# Patient Record
Sex: Female | Born: 1955 | Race: White | Hispanic: No | Marital: Single | Smoking: Never smoker
Health system: Southern US, Community
[De-identification: ages and names within clinical notes are randomized; demographics above are authoritative.]

## PROBLEM LIST (undated history)

## (undated) DIAGNOSIS — T7840XA Allergy, unspecified, initial encounter: Secondary | ICD-10-CM

## (undated) HISTORY — DX: Allergy, unspecified, initial encounter: T78.40XA

## (undated) HISTORY — PX: COLONOSCOPY: SHX174

---

## 1988-12-14 HISTORY — PX: TONSILLECTOMY: SUR1361

## 1988-12-14 HISTORY — PX: SEPTOPLASTY: SUR1290

## 1998-11-15 ENCOUNTER — Other Ambulatory Visit: Admission: RE | Admit: 1998-11-15 | Discharge: 1998-11-15 | Payer: Self-pay | Admitting: Family Medicine

## 1999-11-25 ENCOUNTER — Ambulatory Visit (HOSPITAL_BASED_OUTPATIENT_CLINIC_OR_DEPARTMENT_OTHER): Admission: RE | Admit: 1999-11-25 | Discharge: 1999-11-25 | Payer: Self-pay | Admitting: Otolaryngology

## 2007-01-24 ENCOUNTER — Other Ambulatory Visit: Admission: RE | Admit: 2007-01-24 | Discharge: 2007-01-24 | Payer: Self-pay | Admitting: Family Medicine

## 2012-09-16 ENCOUNTER — Other Ambulatory Visit: Payer: Self-pay | Admitting: Obstetrics and Gynecology

## 2012-09-16 DIAGNOSIS — Z1231 Encounter for screening mammogram for malignant neoplasm of breast: Secondary | ICD-10-CM

## 2012-10-13 ENCOUNTER — Ambulatory Visit: Payer: Self-pay

## 2012-10-20 ENCOUNTER — Ambulatory Visit
Admission: RE | Admit: 2012-10-20 | Discharge: 2012-10-20 | Disposition: A | Discharge status: Home / Self Care | Payer: BC Managed Care – PPO | Source: Ambulatory Visit | Attending: Obstetrics and Gynecology | Admitting: Obstetrics and Gynecology

## 2012-10-20 DIAGNOSIS — Z1231 Encounter for screening mammogram for malignant neoplasm of breast: Secondary | ICD-10-CM

## 2013-10-24 ENCOUNTER — Encounter: Payer: Self-pay | Admitting: Internal Medicine

## 2014-01-02 ENCOUNTER — Ambulatory Visit (AMBULATORY_SURGERY_CENTER): Payer: BC Managed Care – PPO | Admitting: *Deleted

## 2014-01-02 VITALS — Ht 66.0 in | Wt 175.8 lb

## 2014-01-02 DIAGNOSIS — Z1211 Encounter for screening for malignant neoplasm of colon: Secondary | ICD-10-CM

## 2014-01-02 MED ORDER — MOVIPREP 100 G PO SOLR: ORAL | Status: DC

## 2014-01-02 NOTE — Progress Notes (Signed)
No allergies to eggs or soy. No problems with anesthesia.  

## 2014-01-04 ENCOUNTER — Encounter: Payer: Self-pay | Admitting: Internal Medicine

## 2014-01-12 ENCOUNTER — Ambulatory Visit (AMBULATORY_SURGERY_CENTER): Payer: BC Managed Care – PPO | Admitting: Internal Medicine

## 2014-01-12 ENCOUNTER — Encounter: Payer: Self-pay | Admitting: Internal Medicine

## 2014-01-12 VITALS — BP 108/76 | HR 52 | Temp 96.7°F | Resp 17 | Ht 66.0 in | Wt 175.0 lb

## 2014-01-12 DIAGNOSIS — Z1211 Encounter for screening for malignant neoplasm of colon: Secondary | ICD-10-CM

## 2014-01-12 DIAGNOSIS — D126 Benign neoplasm of colon, unspecified: Secondary | ICD-10-CM

## 2014-01-12 MED ORDER — SODIUM CHLORIDE 0.9 % IV SOLN: 500.0000 mL | INTRAVENOUS | Status: DC

## 2014-01-12 NOTE — Progress Notes (Signed)
Called to room to assist during endoscopic procedure.  Patient ID and intended procedure confirmed with present staff. Received instructions for my participation in the procedure from the performing physician.  

## 2014-01-12 NOTE — Progress Notes (Signed)
No complaints noted in the recovery room. Maw   

## 2014-01-12 NOTE — Op Note (Addendum)
[facility]  [address] [place], [address]   COLONOSCOPY PROCEDURE REPORT  PATIENT: Jackie, Marsh.  MR#: [id number] BIRTHDATE: 07/31/1956 , 57  yrs. old GENDER: Female ENDOSCOPIST: Lafayette Dragon, MD REFERRED AC:ZYSAYTK Ronita Hipps, M.D. PROCEDURE DATE:  01/12/2014 PROCEDURE:   Colonoscopy with cold biopsy polypectomy First Screening Colonoscopy - Avg.  risk and is 50 yrs.  old or older Yes.  Prior Negative Screening - Now for repeat screening. N/A  History of Adenoma - Now for follow-up colonoscopy & has been > or = to 3 yrs.  N/A  Polyps Removed Today? Yes. ASA CLASS:   Class I INDICATIONS:Average risk patient for colon cancer. ,MGM with colon cancer MEDICATIONS: MAC sedation, administered by CRNA and Propofol (Diprivan) 260 mg IV  DESCRIPTION OF PROCEDURE:   After the risks benefits and alternatives of the procedure were thoroughly explained, informed consent was obtained.  A digital rectal exam revealed no abnormalities of the rectum.   The LB [id number]-[id number]  endoscope was introduced through the anus and advanced to the cecum, which was identified by both the appendix and ileocecal valve. No adverse events experienced.   The quality of the prep was good, using MoviPrep  The instrument was then slowly withdrawn as the colon was fully examined.      COLON FINDINGS: A diminutive smooth sessile polyp was found in the sigmoid colon.  A polypectomy was performed with cold forceps.  The resection was complete and the polyp tissue was completely retrieved.  Retroflexed views revealed no abnormalities. The time to cecum=7 minutes 31 seconds.  Withdrawal time=7:33 minutes 0 seconds.  The scope was withdrawn and the procedure completed. COMPLICATIONS: There were no complications.  ENDOSCOPIC IMPRESSION: Diminutive sessile polyp was found in the sigmoid colon; polypectomy was performed with cold forceps  RECOMMENDATIONS: 1.  Await pathology results 2.   high fiber diet Recall colonoscopy pending pathology report   eSigned:  Lafayette Dragon, MD 01/12/2014 8:41 AM Revised: 01/12/2014 8:41 AM  cc:

## 2014-01-12 NOTE — Progress Notes (Signed)
Procedure ends, to recovery, report given and VSS. 

## 2014-01-12 NOTE — Patient Instructions (Signed)
YOU HAD AN ENDOSCOPIC PROCEDURE TODAY AT THE [facility]: Refer to the procedure report that was given to you for any specific questions about what was found during the examination.  If the procedure report does not answer your questions, please call your gastroenterologist to clarify.  If you requested that your care partner not be given the details of your procedure findings, then the procedure report has been included in a sealed envelope for you to review at your convenience later.  YOU SHOULD EXPECT: Some feelings of bloating in the abdomen. Passage of more gas than usual.  Walking can help get rid of the air that was put into your GI tract during the procedure and reduce the bloating. If you had a lower endoscopy (such as a colonoscopy or flexible sigmoidoscopy) you may notice spotting of blood in your stool or on the toilet paper. If you underwent a bowel prep for your procedure, then you may not have a normal bowel movement for a few days.  DIET: Your first meal following the procedure should be a light meal and then it is ok to progress to your normal diet.  A half-sandwich or bowl of soup is an example of a good first meal.  Heavy or fried foods are harder to digest and may make you feel nauseous or bloated.  Likewise meals heavy in dairy and vegetables can cause extra gas to form and this can also increase the bloating.  Drink plenty of fluids but you should avoid alcoholic beverages for 24 hours.  ACTIVITY: Your care partner should take you home directly after the procedure.  You should plan to take it easy, moving slowly for the rest of the day.  You can resume normal activity the day after the procedure however you should NOT DRIVE or use heavy machinery for 24 hours (because of the sedation medicines used during the test).    SYMPTOMS TO REPORT IMMEDIATELY: A gastroenterologist can be reached at any hour.  During normal business hours, 8:30 AM to 5:00 PM Monday through Friday,  call [phone].  After hours and on weekends, please call the GI answering service at [phone] who will take a message and have the physician on call contact you.   Following lower endoscopy (colonoscopy or flexible sigmoidoscopy):  Excessive amounts of blood in the stool  Significant tenderness or worsening of abdominal pains  Swelling of the abdomen that is new, acute  Fever of 100F or higher   FOLLOW UP: If any biopsies were taken you will be contacted by phone or by letter within the next 1-3 weeks.  Call your gastroenterologist if you have not heard about the biopsies in 3 weeks.  Our staff will call the home number listed on your records the next business day following your procedure to check on you and address any questions or concerns that you may have at that time regarding the information given to you following your procedure. This is a courtesy call and so if there is no answer at the home number and we have not heard from you through the emergency physician on call, we will assume that you have returned to your regular daily activities without incident.  SIGNATURES/CONFIDENTIALITY: You and/or your care partner have signed paperwork which will be entered into your electronic medical record.  These signatures attest to the fact that that the information above on your After Visit Summary has been reviewed and is understood.  Full responsibility of the confidentiality of   this discharge information lies with you and/or your care-partner.   Handout was given to your care partner on polyps and a high fiber diet with liberal fluid intake. You may resume your current medications today. Please call if any questions or concerns.

## 2014-01-15 ENCOUNTER — Telehealth: Payer: Self-pay | Admitting: *Deleted

## 2014-01-15 NOTE — Telephone Encounter (Signed)
  Follow up Call-  Call back number 01/12/2014  Post procedure Call Back phone  # [phone]  Permission to leave phone message Yes     Patient questions:  Do you have a fever, pain , or abdominal swelling? no Pain Score  0 *  Have you tolerated food without any problems? yes  Have you been able to return to your normal activities? yes  Do you have any questions about your discharge instructions: Diet   no Medications  no Follow up visit  no  Do you have questions or concerns about your Care? no  Actions: * If pain score is 4 or above: No action needed, pain <4.

## 2014-01-16 ENCOUNTER — Encounter: Payer: Self-pay | Admitting: Internal Medicine

## 2014-01-17 ENCOUNTER — Encounter: Payer: Self-pay | Admitting: *Deleted

## 2014-09-21 ENCOUNTER — Ambulatory Visit (INDEPENDENT_AMBULATORY_CARE_PROVIDER_SITE_OTHER): Payer: BC Managed Care – PPO | Admitting: Family

## 2014-09-21 ENCOUNTER — Encounter: Payer: Self-pay | Admitting: Family

## 2014-09-21 VITALS — BP 120/80 | HR 59 | Temp 98.0°F | Ht 66.0 in | Wt 174.4 lb

## 2014-09-21 DIAGNOSIS — N390 Urinary tract infection, site not specified: Secondary | ICD-10-CM

## 2014-09-21 DIAGNOSIS — R319 Hematuria, unspecified: Secondary | ICD-10-CM

## 2014-09-21 DIAGNOSIS — R300 Dysuria: Secondary | ICD-10-CM

## 2014-09-21 LAB — POCT UA - MICROSCOPIC ONLY
CRYSTALS, UR, HPF, POC: NEGATIVE
Casts, Ur, LPF, POC: NEGATIVE
Mucus, UA: NEGATIVE
YEAST UA: NEGATIVE

## 2014-09-21 LAB — POCT URINALYSIS DIPSTICK
Bilirubin, UA: NEGATIVE
Glucose, UA: NEGATIVE
KETONES UA: NEGATIVE
Nitrite, UA: NEGATIVE
Protein, UA: NEGATIVE
Spec Grav, UA: <=1.005
Urobilinogen, UA: NEGATIVE
pH, UA: 6

## 2014-09-21 MED ORDER — NITROFURANTOIN MONOHYD MACRO 100 MG PO CAPS: 100.0000 mg | ORAL_CAPSULE | Freq: Two times a day (BID) | ORAL | Status: DC

## 2014-09-21 NOTE — Progress Notes (Signed)
   Subjective:    Patient ID: Jackie Marsh, female    DOB: 1956-10-24, 58 y.o.   MRN: [id number]  Dysuria  This is a new problem. The current episode started in the past 7 days (Sunday night). The problem occurs every urination. The problem has been unchanged. The quality of the pain is described as burning and shooting. The pain is at a severity of 6/10. The pain is mild. There has been no fever. Associated symptoms include chills, flank pain, frequency, hesitancy and urgency. Pertinent negatives include no discharge, hematuria, nausea or vomiting. Treatments tried: AZO. The treatment provided mild relief.      Review of Systems  Constitutional: Positive for chills.  HENT: Negative.   Eyes: Negative.   Respiratory: Negative.  Negative for shortness of breath.   Cardiovascular: Negative.  Negative for palpitations.  Gastrointestinal: Negative.  Negative for nausea and vomiting.  Endocrine: Negative.   Genitourinary: Positive for dysuria, hesitancy, urgency, frequency and flank pain. Negative for hematuria.  Musculoskeletal: Negative.   Neurological: Negative.  Negative for headaches.  Hematological: Negative.   Psychiatric/Behavioral: Negative.   All other systems reviewed and are negative.      Objective:   Physical Exam  Vitals reviewed. Constitutional: She is oriented to person, place, and time. She appears well-developed and well-nourished. No distress.  Eyes: Pupils are equal, round, and reactive to light.  Neck: Normal range of motion. Neck supple. No thyromegaly present.  Cardiovascular: Normal rate, regular rhythm, normal heart sounds and intact distal pulses.   No murmur heard. Pulmonary/Chest: Effort normal and breath sounds normal. No respiratory distress. She has no wheezes.  Abdominal: Soft. Bowel sounds are normal. She exhibits no distension. There is no tenderness.  Musculoskeletal: Normal range of motion. She exhibits no edema and no tenderness.    Neurological: She is alert and oriented to person, place, and time. She has normal reflexes. No cranial nerve deficit.  Skin: Skin is warm and dry.  Psychiatric: She has a normal mood and affect. Her behavior is normal. Judgment and thought content normal.      BP 120/80  Pulse 59  Temp(Src) 98 F (36.7 C) (Oral)  Ht 5\' 6"  (1.676 m)  Wt 174 lb 6.4 oz (79.107 kg)  BMI 28.16 kg/m2     Assessment & Plan:  1. Dysuria - POCT UA - Microscopic Only - POCT urinalysis dipstick  2. Urinary tract infection with hematuria, site unspecified -Force fluids AZO over the counter X2 days RTO prn - nitrofurantoin, macrocrystal-monohydrate, (MACROBID) 100 MG capsule; Take 1 capsule (100 mg total) by mouth 2 (two) times daily.  Dispense: 10 capsule; Refill: 0  Evelina Dun, FNP

## 2014-09-21 NOTE — Patient Instructions (Signed)

## 2014-09-28 ENCOUNTER — Telehealth: Payer: Self-pay | Admitting: Family

## 2014-09-28 ENCOUNTER — Other Ambulatory Visit (INDEPENDENT_AMBULATORY_CARE_PROVIDER_SITE_OTHER): Payer: BC Managed Care – PPO

## 2014-09-28 ENCOUNTER — Other Ambulatory Visit: Payer: Self-pay | Admitting: Family

## 2014-09-28 DIAGNOSIS — N390 Urinary tract infection, site not specified: Secondary | ICD-10-CM

## 2014-09-28 DIAGNOSIS — B3749 Other urogenital candidiasis: Secondary | ICD-10-CM

## 2014-09-28 LAB — POCT UA - MICROSCOPIC ONLY
Bacteria, U Microscopic: NEGATIVE
CASTS, UR, LPF, POC: NEGATIVE
Crystals, Ur, HPF, POC: NEGATIVE
Mucus, UA: NEGATIVE
RBC, urine, microscopic: NEGATIVE
YEAST UA: NEGATIVE

## 2014-09-28 LAB — POCT URINALYSIS DIPSTICK
Bilirubin, UA: NEGATIVE
Blood, UA: NEGATIVE
Glucose, UA: NEGATIVE
Ketones, UA: NEGATIVE
NITRITE UA: NEGATIVE
PH UA: 5
Protein, UA: NEGATIVE
Spec Grav, UA: 1.01
Urobilinogen, UA: NEGATIVE

## 2014-09-28 NOTE — Telephone Encounter (Signed)
Had patient come in and urine was negative

## 2014-09-28 NOTE — Telephone Encounter (Signed)
Culture pending. Encouraged to plush fluids and take AZO if it helps. We will call with culture results.  She is to f/u if symptoms worsen or do not improve.

## 2014-09-28 NOTE — Progress Notes (Signed)
Lab only 

## 2014-09-28 NOTE — Telephone Encounter (Signed)
Discussed with Shelah Lewandowsky and she requested that she leave a urine specimen.

## 2014-09-28 NOTE — Telephone Encounter (Signed)
She spoke with nurse this am and wants to know if med has been sent to pharmacy.  she called adn med is not there.  Let her know when med is called in. Call her at [phone]

## 2014-10-01 NOTE — Telephone Encounter (Signed)
ntbs urinalysis

## 2014-10-01 NOTE — Telephone Encounter (Signed)
Last seen 09/21/14  [facility]

## 2015-03-03 ENCOUNTER — Ambulatory Visit (INDEPENDENT_AMBULATORY_CARE_PROVIDER_SITE_OTHER): Payer: BC Managed Care – PPO | Admitting: Physician Assistant

## 2015-03-03 VITALS — BP 128/82 | HR 61 | Temp 97.6°F | Resp 16 | Ht 68.0 in | Wt 177.6 lb

## 2015-03-03 DIAGNOSIS — R319 Hematuria, unspecified: Secondary | ICD-10-CM | POA: Diagnosis not present

## 2015-03-03 DIAGNOSIS — N3001 Acute cystitis with hematuria: Secondary | ICD-10-CM | POA: Diagnosis not present

## 2015-03-03 DIAGNOSIS — R300 Dysuria: Secondary | ICD-10-CM

## 2015-03-03 LAB — POCT UA - MICROSCOPIC ONLY
Casts, Ur, LPF, POC: NEGATIVE
Crystals, Ur, HPF, POC: NEGATIVE
MUCUS UA: NEGATIVE
Yeast, UA: NEGATIVE

## 2015-03-03 LAB — POCT URINALYSIS DIPSTICK
Bilirubin, UA: NEGATIVE
Glucose, UA: 100
Ketones, UA: NEGATIVE
NITRITE UA: POSITIVE
PH UA: 5
Protein, UA: 30
Spec Grav, UA: 1.01
Urobilinogen, UA: 1

## 2015-03-03 MED ORDER — CIPROFLOXACIN HCL 500 MG PO TABS: 500.0000 mg | ORAL_TABLET | Freq: Two times a day (BID) | ORAL | Status: DC

## 2015-03-03 NOTE — Patient Instructions (Signed)

## 2015-03-03 NOTE — Progress Notes (Signed)
Subjective:    Patient ID: Jackie Marsh, female    DOB: 1956-01-14, 59 y.o.   MRN: [id number]  HPI Patient presents for dysuria and hematuria that started yesterday. Additionally endorses incomplete emptying, frequency, urgency, lower abdominal/back pain. Is post-menopausal and sexually active with husband of 30 years. Has less lubrication so is uncomfortable at times. Denies vaginal bleeding, discharge, flank pain, fever, N/V, diarrhea, or constipation. Has taken Azo and has had some relief. Most recent UTI 09/2014 treated with macrobid and developed a rash although that has never happened before. Med allergy to erythomycin, macrobid, bactrim, and PCN.    Review of Systems  Constitutional: Negative for fever.  Gastrointestinal: Negative for nausea, vomiting, diarrhea and constipation.  Genitourinary: Positive for dysuria, urgency, frequency, hematuria, decreased urine volume, difficulty urinating, pelvic pain and dyspareunia. Negative for flank pain, vaginal bleeding, vaginal discharge, vaginal pain and menstrual problem.  Musculoskeletal: Positive for back pain.       Objective:   Physical Exam  Constitutional: She is oriented to person, place, and time. She appears well-developed and well-nourished. No distress.  Blood pressure 128/82, pulse 61, temperature 97.6 F (36.4 C), temperature source Oral, resp. rate 16, height 5\' 8"  (1.727 m), weight 177 lb 9.6 oz (80.559 kg), SpO2 100 %.  HENT:  Head: Normocephalic and atraumatic.  Right Ear: External ear normal.  Left Ear: External ear normal.  Eyes: Right eye exhibits no discharge. Left eye exhibits no discharge.  Cardiovascular: Normal rate, regular rhythm and normal heart sounds.  Exam reveals no gallop and no friction rub.   No murmur heard. Pulmonary/Chest: Effort normal and breath sounds normal. No respiratory distress. She has no wheezes. She has no rales.  Abdominal: Soft. Bowel sounds are normal. She exhibits no distension  and no mass. There is tenderness in the periumbilical area and suprapubic area. There is no rigidity, no rebound, no guarding, no CVA tenderness, no tenderness at McBurney's point and negative Murphy's sign. No hernia.  Neurological: She is alert and oriented to person, place, and time.  Skin: Skin is warm and dry. No rash noted. She is not diaphoretic. No erythema. No pallor.   Results for orders placed or performed in visit on 03/03/15  POCT UA - Microscopic Only  Result Value Ref Range   WBC, Ur, HPF, POC 2-6    RBC, urine, microscopic 1-4    Bacteria, U Microscopic 3+    Mucus, UA neg    Epithelial cells, urine per micros 0-2    Crystals, Ur, HPF, POC neg    Casts, Ur, LPF, POC neg    Yeast, UA neg   POCT urinalysis dipstick  Result Value Ref Range   Color, UA orange    Clarity, UA cloudy    Glucose, UA 100    Bilirubin, UA neg    Ketones, UA neg    Spec Grav, UA 1.010    Blood, UA small    pH, UA 5.0    Protein, UA 30    Urobilinogen, UA 1.0    Nitrite, UA positive    Leukocytes, UA Trace       Assessment & Plan:  1. Hematuria 2. Dysuria - POCT UA - Microscopic Only - POCT urinalysis dipstick  3. Acute cystitis with hematuria Increase water intake to 64 oz daily. - ciprofloxacin (CIPRO) 500 MG tablet; Take 1 tablet (500 mg total) by mouth 2 (two) times daily.  Dispense: 14 tablet; Refill: 0   Jackie Lariviere PA-C  [facility] 03/03/2015 2:56 PM

## 2015-12-16 ENCOUNTER — Ambulatory Visit (INDEPENDENT_AMBULATORY_CARE_PROVIDER_SITE_OTHER): Payer: BC Managed Care – PPO

## 2015-12-16 ENCOUNTER — Ambulatory Visit (INDEPENDENT_AMBULATORY_CARE_PROVIDER_SITE_OTHER): Payer: BC Managed Care – PPO | Admitting: Physician Assistant

## 2015-12-16 VITALS — BP 110/74 | HR 56 | Temp 98.0°F | Resp 18 | Ht 68.0 in | Wt 181.0 lb

## 2015-12-16 DIAGNOSIS — M25512 Pain in left shoulder: Secondary | ICD-10-CM

## 2015-12-16 MED ORDER — NAPROXEN 500 MG PO TABS: 500.0000 mg | ORAL_TABLET | Freq: Two times a day (BID) | ORAL | Status: AC

## 2015-12-16 NOTE — Addendum Note (Signed)
Addended by: Roselee Culver on: 12/16/2015 05:47 PM   Modules accepted: Miquel Dunn

## 2015-12-16 NOTE — Progress Notes (Signed)
  Medical screening examination/treatment/procedure(s) were performed by non-physician practitioner and as supervising physician I was immediately available for consultation/collaboration.     

## 2015-12-16 NOTE — Progress Notes (Signed)
12/16/2015 12:59 PM   DOB: [birth year] / MRN: [id number]  SUBJECTIVE:  Jackie Marsh is a 60 y.o. female presenting for left shoulder paint hat started in October.  Pain is worse with shoulder flexion and abduction, and states that she is also waking up due to the pain. No surgery this year.  The pain is getting somewhat worse.  She is a retired Pharmacist, hospital.  Describes the pain as sharp and only occurs with movement. Denies any trauma to the arm in the last year.     She has tried aleve with some relief.  Takes levothyroxine.    She is allergic to erythromycin; penicillins; sulfa antibiotics; and nitrofuran derivatives.   She  has a past medical history of Allergy.    She  reports that she has never smoked. She has never used smokeless tobacco. She reports that she does not drink alcohol or use illicit drugs. She  has no sexual activity history on file. The patient  has past surgical history that includes Septoplasty (1990); Tonsillectomy (1990); and Cesarean section (1984, 1986).  Her family history includes Colon cancer (age of onset: 74) in her maternal grandmother.  Review of Systems  Constitutional: Negative for fever and chills.  Eyes: Negative for blurred vision.  Respiratory: Negative for cough and shortness of breath.   Cardiovascular: Negative for chest pain.  Gastrointestinal: Negative for nausea and abdominal pain.  Genitourinary: Negative for dysuria, urgency and frequency.  Musculoskeletal: Positive for joint pain and falls. Negative for myalgias and neck pain.  Skin: Negative for rash.  Neurological: Negative for dizziness, tingling and headaches.  Psychiatric/Behavioral: Negative for depression. The patient is not nervous/anxious.     Problem list and medications reviewed and updated by myself where necessary, and exist elsewhere in the encounter.   OBJECTIVE:  BP 110/74 mmHg  Pulse 56  Temp(Src) 98 F (36.7 C) (Oral)  Resp 18  Ht 5\' 8"  (1.727 m)  Wt 181 lb  (82.101 kg)  BMI 27.53 kg/m2  SpO2 98%  Physical Exam  Constitutional: She is oriented to person, place, and time. She appears well-nourished. No distress.  Eyes: EOM are normal. Pupils are equal, round, and reactive to light.  Cardiovascular: Normal rate.   Pulmonary/Chest: Effort normal.  Abdominal: She exhibits no distension.  Musculoskeletal:       Right shoulder: Normal.       Left shoulder: She exhibits decreased range of motion, crepitus, pain and decreased strength. She exhibits no tenderness, no bony tenderness, no swelling, no deformity and no laceration.       Arms: Neurological: She is alert and oriented to person, place, and time. No cranial nerve deficit. Gait normal.  Skin: Skin is dry. She is not diaphoretic.  Psychiatric: She has a normal mood and affect.  Vitals reviewed.  [facility] reading (PRIMARY) by  PA Clark: STAT read please comment.   No results found for this or any previous visit (from the past 48 hour(s)).  ASSESSMENT AND PLAN  Jackie Marsh was seen today for shoulder injury and shoulder pain.  Diagnoses and all orders for this visit:  Left shoulder pain: Most likely a rotator cuff tendinopathy. Will try naproxen 500 bid for two weeks and an arm sling. She would benefit from physical therapy after this trial and I will send that today with plans for her to begin therapy in 2 weeks.  If this fails will refer to orthopedics.   -     DG Shoulder Left; Future  The patient was advised to call or return to clinic if she does not see an improvement in symptoms or to seek the care of the closest emergency department if she worsens with the above plan.   Jackie Marsh, MHS, PA-C [facility] 12/16/2015 12:59 PM

## 2015-12-29 ENCOUNTER — Ambulatory Visit (INDEPENDENT_AMBULATORY_CARE_PROVIDER_SITE_OTHER): Payer: BC Managed Care – PPO | Admitting: Physician Assistant

## 2015-12-29 VITALS — BP 120/70 | HR 64 | Temp 98.2°F | Resp 16 | Ht 67.5 in | Wt 183.4 lb

## 2015-12-29 DIAGNOSIS — H109 Unspecified conjunctivitis: Secondary | ICD-10-CM

## 2015-12-29 MED ORDER — POLYMYXIN B-TRIMETHOPRIM 10000-0.1 UNIT/ML-% OP SOLN: 1.0000 [drp] | OPHTHALMIC | Status: AC

## 2015-12-29 NOTE — Progress Notes (Signed)
   12/29/2015 2:41 PM   DOB: 1956-09-18 / MRN: [id number]  SUBJECTIVE:  Jackie Marsh is a 60 y.o. female presenting for left sided eye redness and mild irritation that has been present for three days.  Reports the right eye is now starting to suffer from similar symptoms.  Denies photophobia, contact lens use, nausea, and headache.  No change in vision.   She is allergic to erythromycin; penicillins; sulfa antibiotics; and nitrofuran derivatives.   She  has a past medical history of Allergy.    She  reports that she has never smoked. She has never used smokeless tobacco. She reports that she does not drink alcohol or use illicit drugs. She  has no sexual activity history on file. The patient  has past surgical history that includes Septoplasty (1990); Tonsillectomy (1990); and Cesarean section (1984, 1986).  Her family history includes Colon cancer (age of onset: 19) in her maternal grandmother.  Review of Systems  Constitutional: Negative for fever.  Eyes: Negative for blurred vision.  Respiratory: Negative for cough.   Musculoskeletal: Negative for neck pain.  Skin: Negative for rash.  Neurological: Negative for dizziness and headaches.    Problem list and medications reviewed and updated by myself where necessary, and exist elsewhere in the encounter.   OBJECTIVE:  BP 120/70 mmHg  Pulse 64  Temp(Src) 98.2 F (36.8 C) (Oral)  Resp 16  Ht 5' 7.5" (1.715 m)  Wt 183 lb 6.4 oz (83.19 kg)  BMI 28.28 kg/m2  SpO2 98%  Physical Exam  Constitutional: She is oriented to person, place, and time.  HENT:  Right Ear: External ear normal.  Left Ear: External ear normal.  Nose: No mucosal edema. Right sinus exhibits no maxillary sinus tenderness and no frontal sinus tenderness. Left sinus exhibits no maxillary sinus tenderness and no frontal sinus tenderness.  Mouth/Throat: Oropharynx is clear and moist. No oropharyngeal exudate.  Eyes: EOM are normal. Pupils are equal, round, and  reactive to light. Right eye exhibits discharge (greenish, bilateral). Left eye exhibits discharge. Right conjunctiva is injected. Left conjunctiva is injected. Scleral icterus is present.  Cardiovascular: Regular rhythm and normal heart sounds.   Pulmonary/Chest: Effort normal and breath sounds normal. She has no rales.  Neurological: She is alert and oriented to person, place, and time.  Skin: Skin is warm and dry. No rash noted. She is not diaphoretic. No erythema.  Psychiatric: Her behavior is normal.    No results found for this or any previous visit (from the past 48 hour(s)).   ASSESSMENT AND PLAN  Dearia was seen today for left eye matted shut this morning and nasal congestion.  Diagnoses and all orders for this visit:  Bilateral conjunctivitis: No alarm symptoms.  Exam negative.  Vision 20/25 bilaterally by my exam. Advised warm compress and fill antibiotic in 24 hours if symptoms are not improving or if symptoms change.   -     trimethoprim-polymyxin b (POLYTRIM) ophthalmic solution; Place 1 drop into both eyes every 4 (four) hours.   The patient was advised to call or return to clinic if she does not see an improvement in symptoms or to seek the care of the closest emergency department if she worsens with the above plan.   Philis Fendt, MHS, PA-C [facility] 12/29/2015 2:41 PM

## 2015-12-30 NOTE — Progress Notes (Signed)
  Medical screening examination/treatment/procedure(s) were performed by non-physician practitioner and as supervising physician I was immediately available for consultation/collaboration.     

## 2016-01-07 ENCOUNTER — Encounter: Payer: Self-pay | Admitting: Physician Assistant

## 2017-07-07 IMAGING — CR DG SHOULDER 2+V*L*
3 series · 3 of 3 positions shown · non-contrast
Comparison: No priors.

CLINICAL DATA: 59-year-old female with left shoulder pain since
September 2015.

EXAM:
LEFT SHOULDER - 2+ VIEW

[AP (1 of 2)]
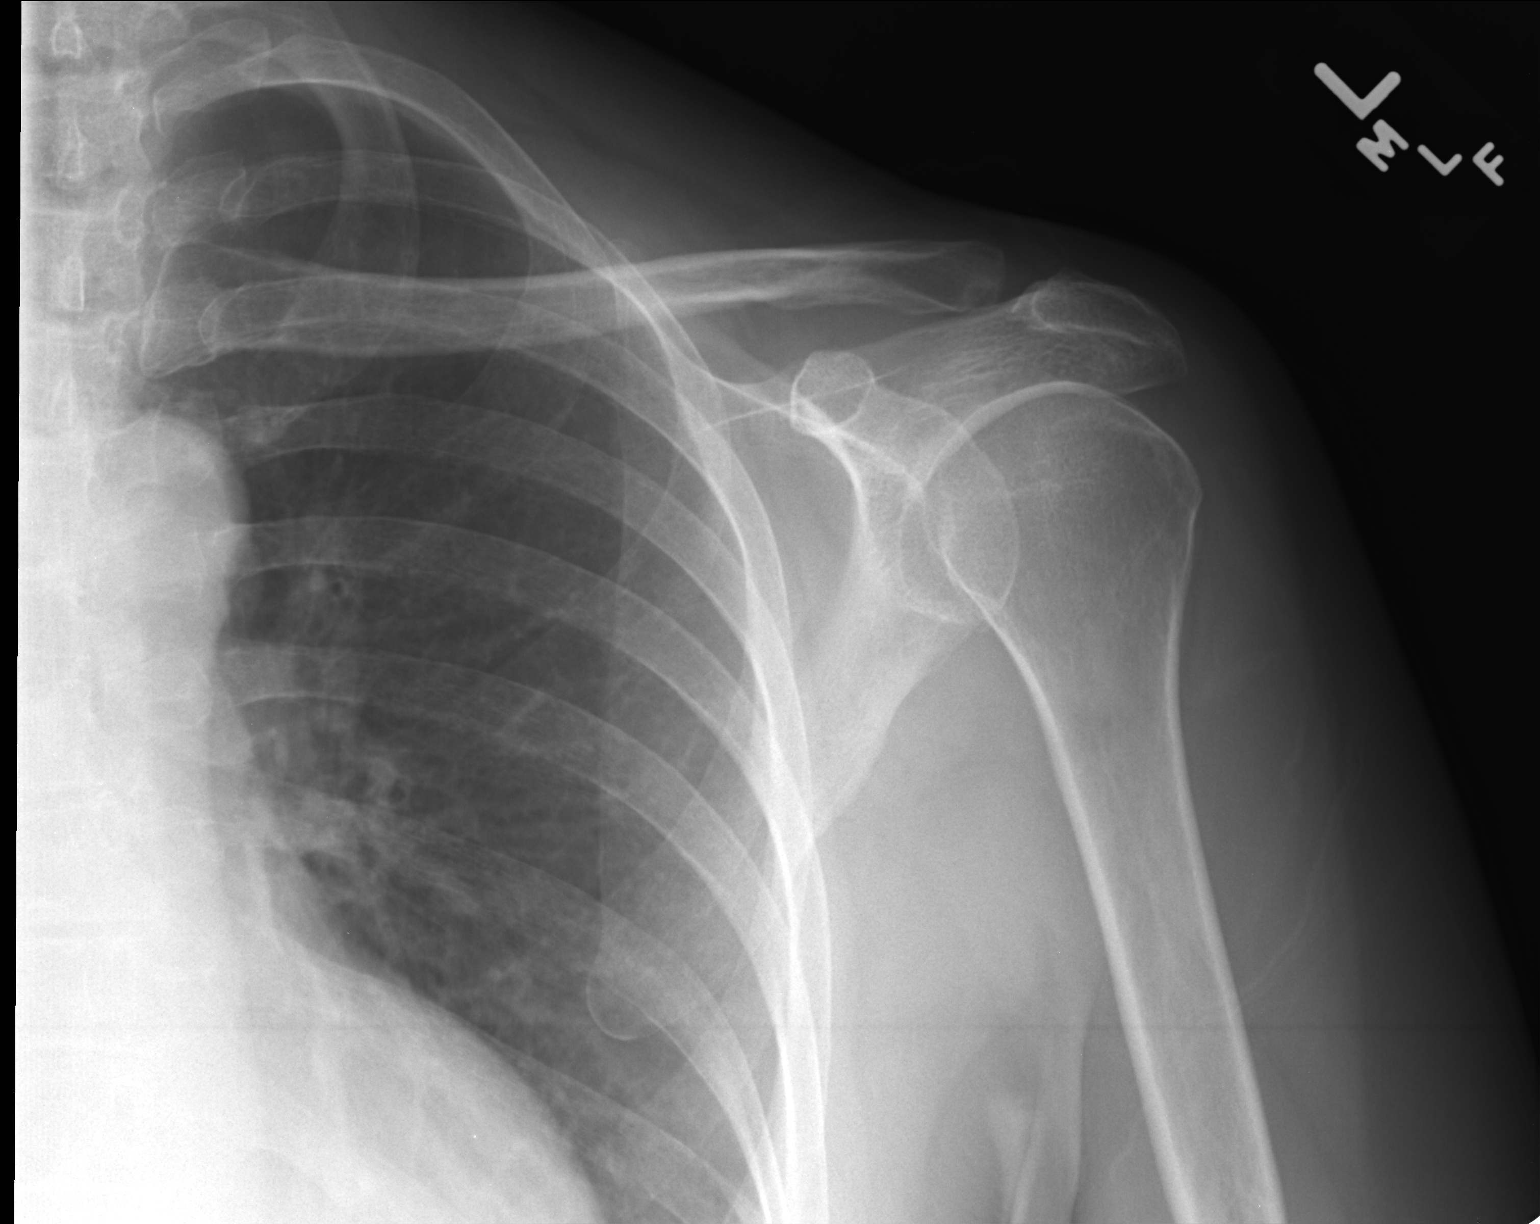

[AP (2 of 2)]
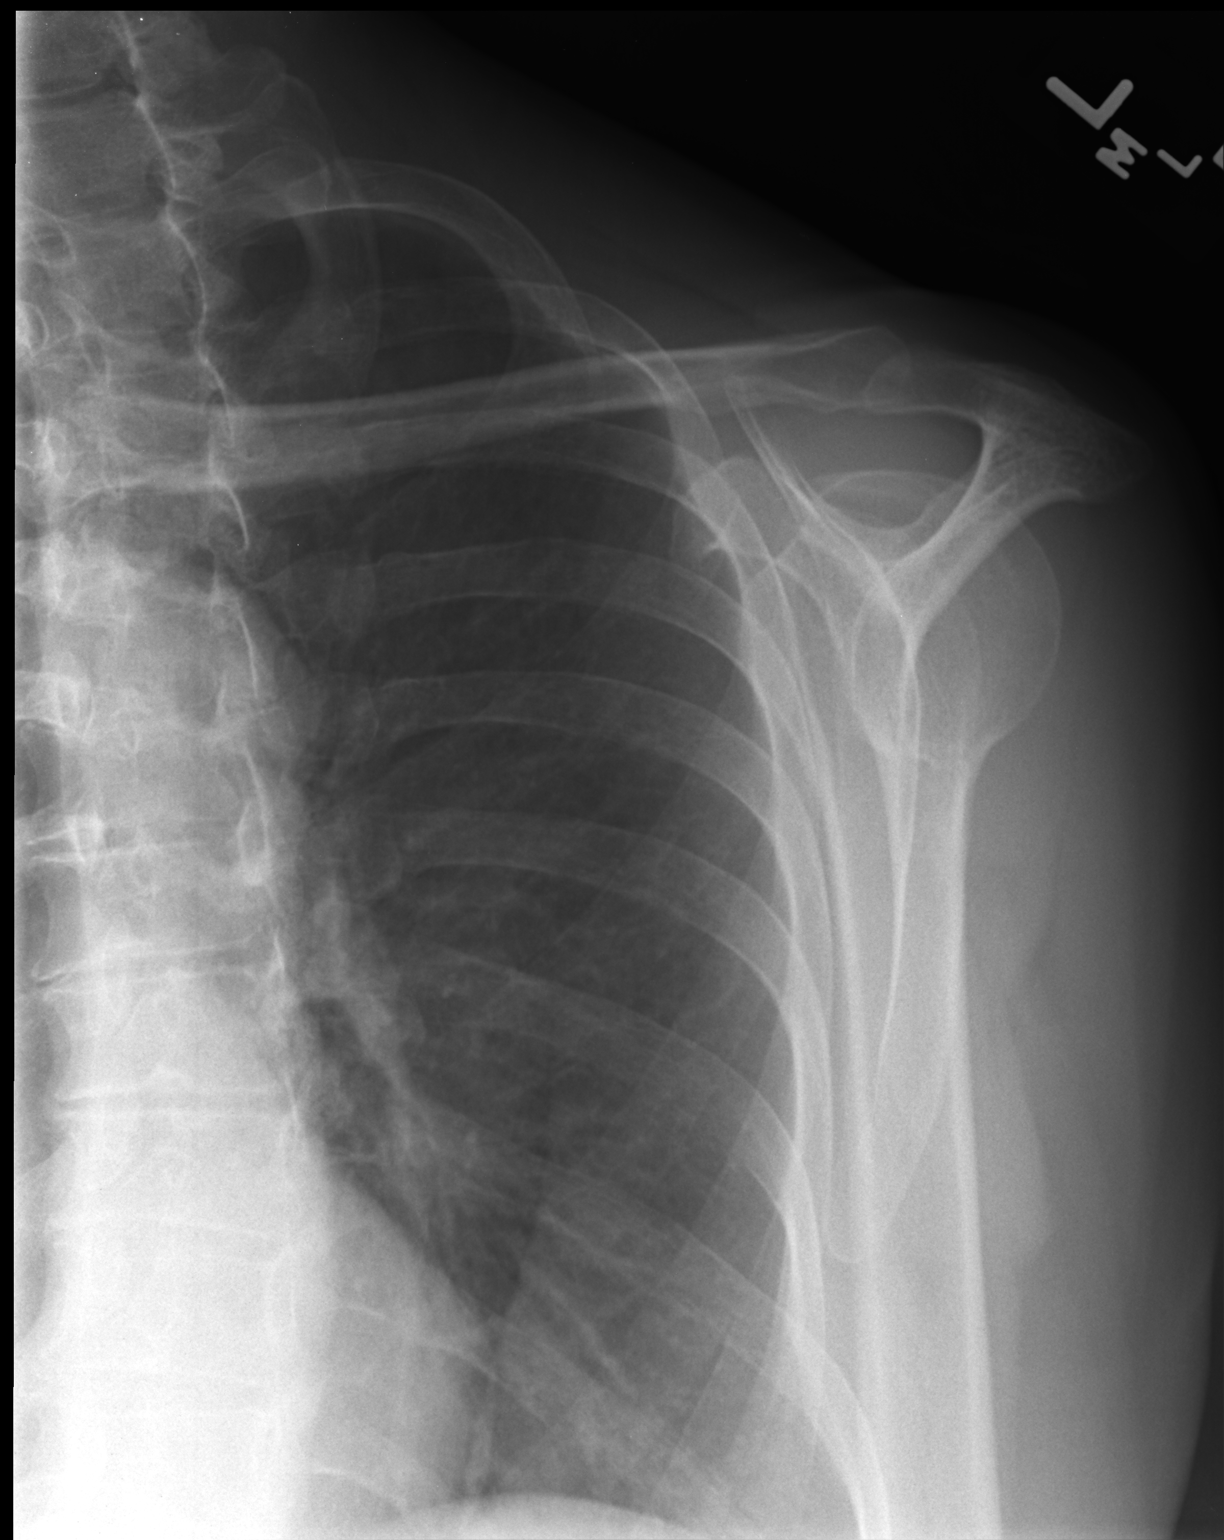

[axial]
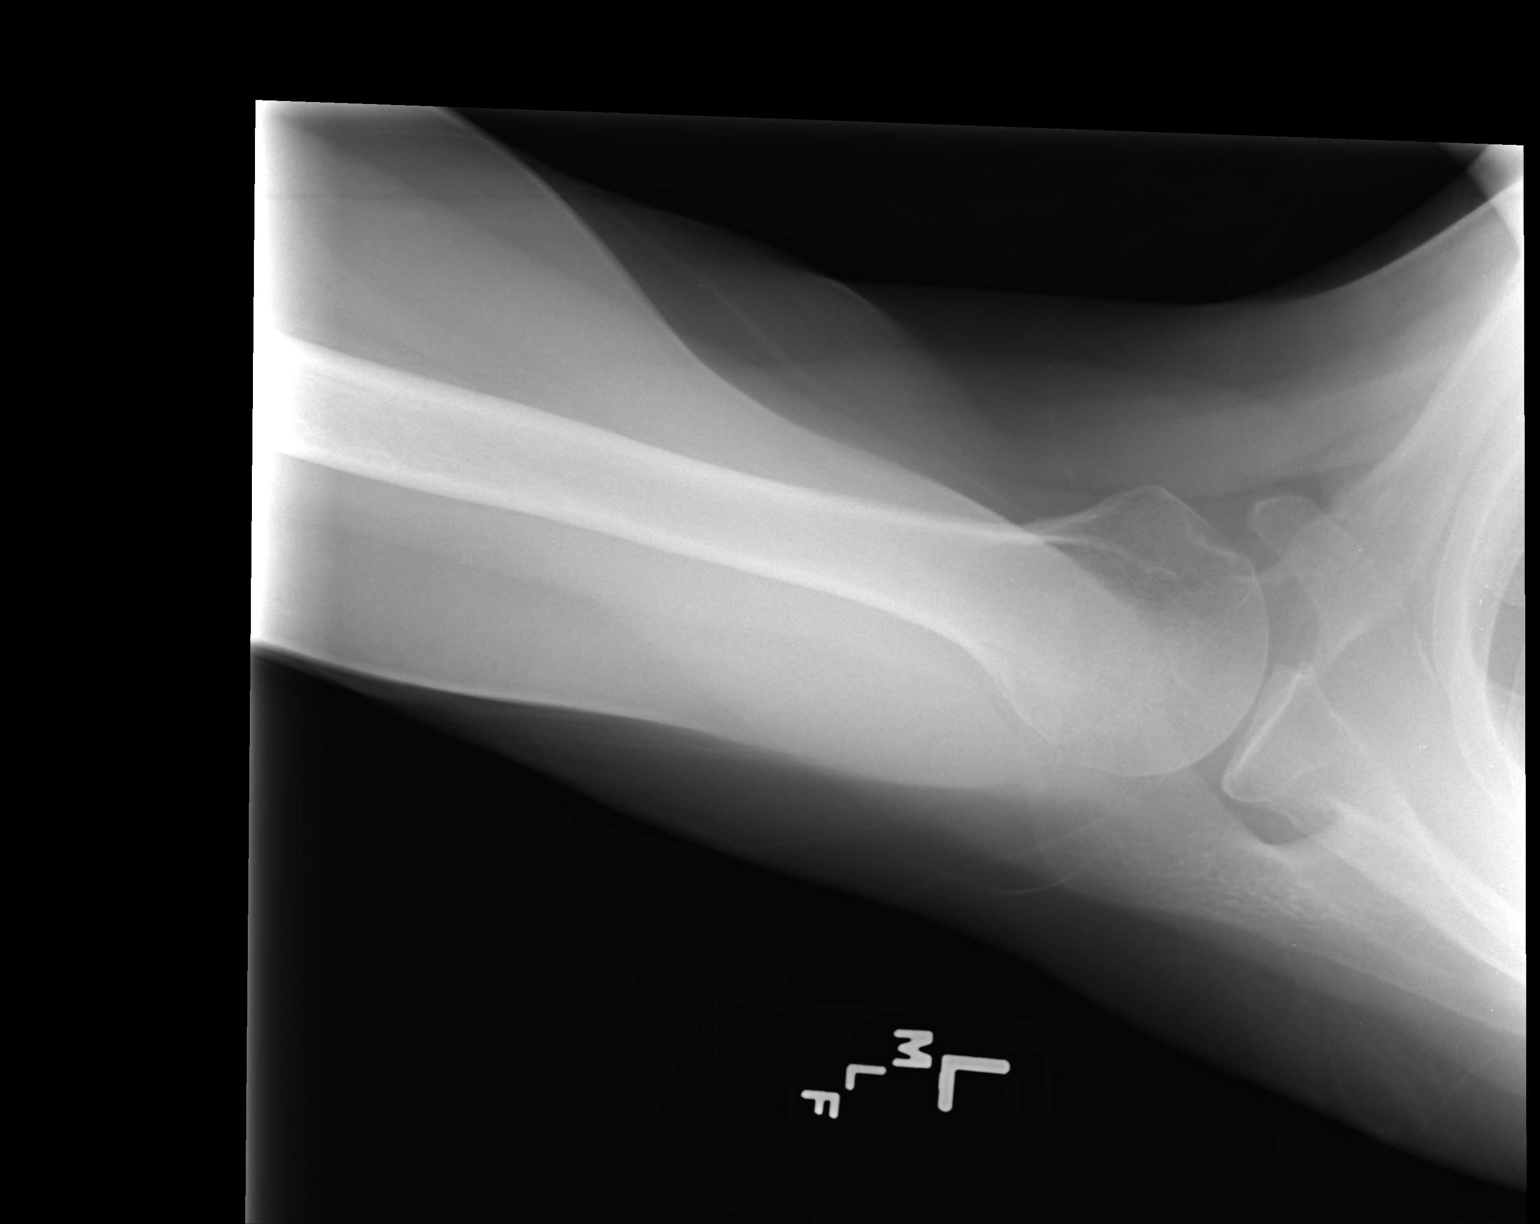

[3 of 3 positions shown; findings below may reference images not displayed]

FINDINGS: There is no evidence of fracture or dislocation. There is no
evidence of arthropathy or other focal bone abnormality. Soft
tissues are unremarkable.
IMPRESSION: Negative.

## 2021-10-17 ENCOUNTER — Ambulatory Visit: Payer: BC Managed Care – PPO | Attending: Sports Medicine | Admitting: Physical Therapy

## 2021-10-17 ENCOUNTER — Other Ambulatory Visit: Payer: Self-pay

## 2021-10-17 ENCOUNTER — Encounter: Payer: Self-pay | Admitting: Physical Therapy

## 2021-10-17 DIAGNOSIS — M25672 Stiffness of left ankle, not elsewhere classified: Secondary | ICD-10-CM | POA: Insufficient documentation

## 2021-10-17 DIAGNOSIS — M79672 Pain in left foot: Secondary | ICD-10-CM | POA: Diagnosis present

## 2021-10-17 NOTE — Patient Instructions (Signed)
[place] EXERCISE PROGRAM Created by [place] Kevyn Boquet Nov 4th, 2022 View at www.my-exercise-code.com using code: [facility] Total 1 Page 1 of [place] Standing with the involved leg back and the heel on the floor, lean toward the wall until you feel a stretch in the calf. Perform with the knee locked as well as with the knee slightly bent. Repeat 4 Times Hold 1 Minute Complete 1 Set Perform 3 Times a Day

## 2021-10-17 NOTE — Therapy (Signed)
[facility] [place], [place], [address] Phone: [phone]   Fax:  [phone]  Physical Therapy Evaluation  Patient Details  Name: Jackie Marsh MRN: [id number] Date of Birth: 12/28/1955 Referring Provider (PT): Becky Sax MD   Encounter Date: 10/17/2021   PT End of Session - 10/17/21 1118     Visit Number 1    Number of Visits 12    Date for PT Re-Evaluation 11/28/21    PT Start Time 0815    PT Stop Time 0901    PT Time Calculation (min) 46 min    Activity Tolerance Patient tolerated treatment well    Behavior During Therapy [facility] for tasks assessed/performed             Past Medical History:  Diagnosis Date   Allergy     Past Surgical History:  Procedure Laterality Date   CESAREAN SECTION  1984, [place]    There were no vitals filed for this visit.    Subjective Assessment - 10/17/21 1125     Subjective COVID-19 screen performed prior to patient entering clinic.  The pateint presents to the clinic today with a diagnosis of a closed fracture of the base of her fifith metatarsal bone of the left foot at the metaphyseal-diaphyseal juntion.  The occured on 08/13/21 when she tripped and caught herself.  She was in a boot for 6 weeks.  She states between initial and more recent X-rays the space between the fracture is actually farther apart.  She hopes to avoid surgery and wants to walk better.  Her pain-level is 2 at rest today but can rise to a 4 when standing for long periods of time.  Elevation makes her foot feel better.    Pertinent History Unremarkable.    How long can you stand comfortably? >30 minutes.    How long can you walk comfortably? Okay with community distances but pain will rise the longger she is up.    Diagnostic tests X-rays.    Patient Stated Goals Avoid surgery.  Walk better.    Currently in Pain? Yes    Pain Score 2     Pain Location Foot     Pain Orientation Left    Pain Descriptors / Indicators Aching;Dull    Pain Type Acute pain    Pain Onset More than a month ago    Pain Frequency Intermittent    Aggravating Factors  See above.    Pain Relieving Factors See above.                [facility] PT Assessment - 10/17/21 0001       Assessment   Medical Diagnosis Closed fracture of base of fifth metatarsal, left.    Referring Provider (PT) Becky Sax MD    Onset Date/Surgical Date 08/13/21      Precautions   Precaution Comments Avoid heel lifts and stressing the metatarsals.      Restrictions   Weight Bearing Restrictions No      Balance Screen   Has the patient fallen in the past 6 months Yes    Has the patient had a decrease in activity level because of a fear of falling?  No    Is the patient reluctant to leave their home because of a fear of falling?  No      Home Ec[employer] residence  Prior Function   Level of Independence Independent      Observation/Other Assessments-Edema    Edema --   No observable left foot edema.     ROM / Strength   AROM / PROM / Strength AROM;Strength      AROM   Overall AROM Comments Active left ankle dorsiflexion wiht knee in full extension is 0 degrees compared to left which is 10 degrees.  Active left ankle dorsilfexion with knee flexed is 15 degrees, inversion is 15 degrees, evresion is 5 degrees.      Strength   Overall Strength Comments Left ankle inversion/eversion graded grossly at 4-/5.      Palpation   Palpation comment Minimal at most tenderness over base of fifth MT on left.  Patient c/o pain over the dorsum of her left foot with prolonged standing.      Ambulation/Gait   Gait Comments The patient's gait is remarkable for a flat-footed left foot gait cycle due to lack of dorsiflexion.                        Objective measurements completed on examination: See above findings.       [facility] Adult PT  Treatment/Exercise - 10/17/21 0001       Modalities   Modalities Electrical Stimulation;Moist Heat      Moist Heat Therapy   Number Minutes Moist Heat 20 Minutes    Moist Heat Location --   Left foot.     Electrical Stimulation   Electrical Stimulation Location Left base of metatarsal and dorsum of left foot.    Electrical Stimulation Action Pre-mod.    Electrical Stimulation Parameters 80-150 Hz. x 20 minutes.    Electrical Stimulation Goals Pain                          PT Long Term Goals - 10/17/21 1146       PT LONG TERM GOAL #1   Title Independent with a HEP.    Time 6    Period Weeks    Status New      PT LONG TERM GOAL #2   Title Active left ankle dorsiflexion to 10 degrees to help normalize gait pattern.    Time 6    Period Weeks    Status New      PT LONG TERM GOAL #3   Title Stand/walk 30 minutes+ with pain not > 2/10.    Time 6    Period Weeks    Status New      PT LONG TERM GOAL #4   Title Walk a mile with pain not > 2/10.    Time 6    Period Weeks    Status New                    Plan - 10/17/21 1140     Clinical Impression Statement The patient presents to O[facility] s/p left base of metatrasal fracture on 08/13/21.  She was in a boot for 6 weeks.  She states her fracture is not healed.  She lacks active left ankle dorsiflexion and has a flat-footed gait pattern on the left due to her lack of dorsiflexion.  She has some strength loss into left ankle inversion/eversion.  Her pain increases with proloned standing and walking and she will experinces pain over the dorsum of her left foot.  Patient will benefit from skilled physical therapy intervention  to address pain and deficits.    Personal Factors and Comorbidities Other    Examination-Activity Limitations Locomotion Level;Other    Examination-Participation Restrictions Other    Stability/Clinical Decision Making Evolving/Moderate complexity    Clinical Decision Making Low     Rehab Potential Good    PT Frequency 2x / week    PT Duration 6 weeks    PT Treatment/Interventions ADLs/Self Care Home Management;Cryotherapy;Electrical Stimulation;Moist Heat;Iontophoresis 4mg /ml Dexamethasone;Gait training;Therapeutic activities;Therapeutic exercise;Neuromuscular re-education;Manual techniques;Patient/family education;Passive range of motion    PT Next Visit Plan Review standing Gatroc stretch.  Added Soleus stretch.  Theraband exercises and ankle isolator for strengthening.  GT. Electrical stimulation.    Consulted and Agree with Plan of Care Patient             Patient will benefit from skilled therapeutic intervention in order to improve the following deficits and impairments:  Abnormal gait, Pain, Decreased range of motion, Decreased strength  Visit Diagnosis: Pain in left foot - Plan: PT plan of care cert/re-cert  Stiffness of left ankle, not elsewhere classified - Plan: PT plan of care cert/re-cert     Problem List There are no problems to display for this patient.   Darcella Shiffman, [place], PT 10/17/2021, 11:50 AM  [facility] [address] [place], [place], [address] Phone: [phone]   Fax:  [phone]  Name: SABENA WINNER MRN: [id number] Date of Birth: 09/16/1956

## 2021-10-24 ENCOUNTER — Ambulatory Visit: Payer: BC Managed Care – PPO | Admitting: Physical Therapy

## 2021-10-29 ENCOUNTER — Other Ambulatory Visit: Payer: Self-pay

## 2021-10-29 ENCOUNTER — Ambulatory Visit: Payer: BC Managed Care – PPO

## 2021-10-29 DIAGNOSIS — M79672 Pain in left foot: Secondary | ICD-10-CM | POA: Diagnosis not present

## 2021-10-29 DIAGNOSIS — M25672 Stiffness of left ankle, not elsewhere classified: Secondary | ICD-10-CM

## 2021-10-29 NOTE — Therapy (Signed)
[facility] [place], [place], [address] Phone: [phone]   Fax:  [phone]  Physical Therapy Treatment  Patient Details  Name: Jackie Marsh MRN: [id number] Date of Birth: 04/26/1956 Referring Provider (PT): Becky Sax MD   Encounter Date: 10/29/2021   PT End of Session - 10/29/21 0820     Visit Number 2    Number of Visits 12    Date for PT Re-Evaluation 11/28/21    PT Start Time 0815    PT Stop Time 0904    PT Time Calculation (min) 49 min    Activity Tolerance Patient tolerated treatment well    Behavior During Therapy [facility] for tasks assessed/performed             Past Medical History:  Diagnosis Date   Allergy     Past Surgical History:  Procedure Laterality Date   CESAREAN SECTION  1984, [place]    There were no vitals filed for this visit.   Subjective Assessment - 10/29/21 0813     Subjective COVID-19 screen performed prior to patient entering clinic.  Pt arrives for today's treatment session with 2/10 left foot pain.  Pt states at time the top of her foot hurts after she has been walking a lot    Pertinent History Unremarkable.    How long can you stand comfortably? >30 minutes.    How long can you walk comfortably? Okay with community distances but pain will rise the longger she is up.    Diagnostic tests X-rays.    Patient Stated Goals Avoid surgery.  Walk better.    Currently in Pain? Yes    Pain Score 2     Pain Location Foot    Pain Orientation Left    Pain Onset More than a month ago                               [facility] Adult PT Treatment/Exercise - 10/29/21 0001       Exercises   Exercises Ankle      Modalities   Modalities Electrical Stimulation      Moist Heat Therapy   Number Minutes Moist Heat 15 Minutes      Electrical Stimulation   Electrical Stimulation Location Left bas of metatarsal and dorsum of  left foot    Electrical Stimulation Action pre-mod    Electrical Stimulation Parameters 80-150 Hz x 15 mins    Electrical Stimulation Goals Pain      Ankle Exercises: Stretches   Soleus Stretch 4 reps;10 seconds   Both   Gastroc Stretch 30 seconds;3 reps   on Rockerboard     Ankle Exercises: Aerobic   Nustep Lvl 2 x 12 mins      Ankle Exercises: Standing   Rocker Board 3 minutes      Ankle Exercises: Seated   Other Seated Ankle Exercises Dorsiflexion/Plantarflexion 20 reps; yellow    Other Seated Ankle Exercises Inversion/Eversion; 20x; yellow                          PT Long Term Goals - 10/17/21 1146       PT LONG TERM GOAL #1   Title Independent with a HEP.    Time 6    Period Weeks    Status New  PT LONG TERM GOAL #2   Title Active left ankle dorsiflexion to 10 degrees to help normalize gait pattern.    Time 6    Period Weeks    Status New      PT LONG TERM GOAL #3   Title Stand/walk 30 minutes+ with pain not > 2/10.    Time 6    Period Weeks    Status New      PT LONG TERM GOAL #4   Title Walk a mile with pain not > 2/10.    Time 6    Period Weeks    Status New                   Plan - 10/29/21 0820     Clinical Impression Statement Pt presents to today's treatment reporting 2/10 left foot pain.  Pt states that when she is on her feet or walks a lot the top of her foot hurts.  Pt tolerates addition of rockerboard and rockerboard gastroc stretch without issue or complaint.  Pt instructed in seated tband ankle exercises to increase strength in left ankle.  Pt requiring cues for technique and to avoid compensatory knee movements with eversion and inversion.  Pt given printed HEP and tband of these 4 exercises.  Pt verbalized and demonstrated understanding.  Pt reported 0/10 left foot pain at completion of today's treatment session.    Personal Factors and Comorbidities Other    Examination-Activity Limitations Locomotion Level;Other     Examination-Participation Restrictions Other    Stability/Clinical Decision Making Evolving/Moderate complexity    Rehab Potential Good    PT Frequency 2x / week    PT Duration 6 weeks    PT Treatment/Interventions ADLs/Self Care Home Management;Cryotherapy;Electrical Stimulation;Moist Heat;Iontophoresis 4mg /ml Dexamethasone;Gait training;Therapeutic activities;Therapeutic exercise;Neuromuscular re-education;Manual techniques;Patient/family education;Passive range of motion    PT Next Visit Plan Review standing Gatroc stretch.  Added Soleus stretch.  Theraband exercises and ankle isolator for strengthening.  GT. Electrical stimulation.    Consulted and Agree with Plan of Care Patient             Patient will benefit from skilled therapeutic intervention in order to improve the following deficits and impairments:  Abnormal gait, Pain, Decreased range of motion, Decreased strength  Visit Diagnosis: Pain in left foot  Stiffness of left ankle, not elsewhere classified     Problem List There are no problems to display for this patient.   Kathrynn Ducking, PTA 10/29/2021, 9:07 AM  [facility] [address] [place], [place], [address] Phone: [phone]   Fax:  [phone]  Name: JULYSSA KYER MRN: [id number] Date of Birth: [birth year]

## 2021-11-05 ENCOUNTER — Other Ambulatory Visit: Payer: Self-pay

## 2021-11-05 ENCOUNTER — Ambulatory Visit: Payer: BC Managed Care – PPO

## 2021-11-05 DIAGNOSIS — M25672 Stiffness of left ankle, not elsewhere classified: Secondary | ICD-10-CM

## 2021-11-05 DIAGNOSIS — M79672 Pain in left foot: Secondary | ICD-10-CM

## 2021-11-05 NOTE — Therapy (Signed)
[facility] [place], [place], [address] Phone: [phone]   Fax:  [phone]  Physical Therapy Treatment  Patient Details  Name: Jackie Marsh MRN: [id number] Date of Birth: [birth year] Referring Provider (PT): Becky Sax MD   Encounter Date: 11/05/2021   PT End of Session - 11/05/21 0802     Visit Number 3    Number of Visits 12    Date for PT Re-Evaluation 11/28/21    PT Start Time 0815    PT Stop Time 0903    PT Time Calculation (min) 48 min    Activity Tolerance Patient tolerated treatment well    Behavior During Therapy [facility] for tasks assessed/performed             Past Medical History:  Diagnosis Date   Allergy     Past Surgical History:  Procedure Laterality Date   CESAREAN SECTION  1984, [place]    There were no vitals filed for this visit.   Subjective Assessment - 11/05/21 0802     Subjective COVID-19 screen performed prior to patient entering clinic.  Pt denies any pain upon arriving for today's treatment session.  Pt states that she has been able to walk 1 hour a day without pain.    Pertinent History Unremarkable.    How long can you stand comfortably? >30 minutes.    How long can you walk comfortably? Okay with community distances but pain will rise the longger she is up.    Diagnostic tests X-rays.    Patient Stated Goals Avoid surgery.  Walk better.    Currently in Pain? No/denies    Pain Onset More than a month ago                               [facility] Adult PT Treatment/Exercise - 11/05/21 0001       Modalities   Modalities Electrical Stimulation      Electrical Stimulation   Electrical Stimulation Location Left base of metatarsal and dorsum of left foot    Electrical Stimulation Action Pre-mod    Electrical Stimulation Parameters 80-150 Hz x 15 mins    Electrical Stimulation Goals Pain      Ankle Exercises:  Aerobic   Nustep Lvl 3 x 15 mins      Ankle Exercises: Standing   Rocker Board 3 minutes    Heel Raises 20 reps    Toe Raise 20 reps      Ankle Exercises: Stretches   Soleus Stretch 4 reps;10 seconds    Gastroc Stretch 30 seconds;3 reps   on Rockerboard     Ankle Exercises: Seated   Other Seated Ankle Exercises Dorsiflexion/Plantarflexion 20 reps; yellow    Other Seated Ankle Exercises Inversion/Eversion; x20; yellow                          PT Long Term Goals - 10/17/21 1146       PT LONG TERM GOAL #1   Title Independent with a HEP.    Time 6    Period Weeks    Status New      PT LONG TERM GOAL #2   Title Active left ankle dorsiflexion to 10 degrees to help normalize gait pattern.    Time 6    Period Weeks    Status New  PT LONG TERM GOAL #3   Title Stand/walk 30 minutes+ with pain not > 2/10.    Time 6    Period Weeks    Status New      PT LONG TERM GOAL #4   Title Walk a mile with pain not > 2/10.    Time 6    Period Weeks    Status New                   Plan - 11/05/21 0802     Clinical Impression Statement Pt arrives to today's treatment session denying any pain.  Pt states that she has felt "really good" since her last treatment.  Pt able to tolerate increased time and resistance on Nustep for warmup without issue or complaint.  Pt instructed in heel and toe raises with good carryover.  Pt expresses relief with soleus and gastroc stretches and encouraged to continue to perform them with HEP as originally instructed.  Pt denied any pain at the completion of today's treatment session.    Personal Factors and Comorbidities Other    Examination-Activity Limitations Locomotion Level;Other    Examination-Participation Restrictions Other    Stability/Clinical Decision Making Evolving/Moderate complexity    Rehab Potential Good    PT Frequency 2x / week    PT Duration 6 weeks    PT Treatment/Interventions ADLs/Self Care Home  Management;Cryotherapy;Electrical Stimulation;Moist Heat;Iontophoresis 4mg /ml Dexamethasone;Gait training;Therapeutic activities;Therapeutic exercise;Neuromuscular re-education;Manual techniques;Patient/family education;Passive range of motion    PT Next Visit Plan Review standing Gatroc stretch.  Added Soleus stretch.  Theraband exercises and ankle isolator for strengthening.  GT. Electrical stimulation.    Consulted and Agree with Plan of Care Patient             Patient will benefit from skilled therapeutic intervention in order to improve the following deficits and impairments:  Abnormal gait, Pain, Decreased range of motion, Decreased strength  Visit Diagnosis: Pain in left foot  Stiffness of left ankle, not elsewhere classified     Problem List There are no problems to display for this patient.   Kathrynn Ducking, PTA 11/05/2021, 9:03 AM  [facility] [address] [place], [place], [address] Phone: [phone]   Fax:  [phone]  Name: Jackie Marsh MRN: [id number] Date of Birth: Apr 24, 1956

## 2021-11-12 ENCOUNTER — Ambulatory Visit: Payer: BC Managed Care – PPO | Admitting: Physical Therapy

## 2021-11-12 ENCOUNTER — Other Ambulatory Visit: Payer: Self-pay

## 2021-11-12 ENCOUNTER — Encounter: Payer: Self-pay | Admitting: Physical Therapy

## 2021-11-12 DIAGNOSIS — M79672 Pain in left foot: Secondary | ICD-10-CM | POA: Diagnosis not present

## 2021-11-12 DIAGNOSIS — M25672 Stiffness of left ankle, not elsewhere classified: Secondary | ICD-10-CM

## 2021-11-12 NOTE — Therapy (Signed)
[facility] [place], [place], [address] Phone: [phone]   Fax:  [phone]  Physical Therapy Treatment  Patient Details  Name: Jackie Marsh MRN: [id number] Date of Birth: [birth year] Referring Provider (PT): Becky Sax MD   Encounter Date: 11/12/2021   PT End of Session - 11/12/21 0856     Visit Number 4    Number of Visits 12    Date for PT Re-Evaluation 11/28/21    PT Start Time 0816    PT Stop Time 0906    PT Time Calculation (min) 50 min    Activity Tolerance Patient tolerated treatment well    Behavior During Therapy [facility] for tasks assessed/performed             Past Medical History:  Diagnosis Date   Allergy     Past Surgical History:  Procedure Laterality Date   CESAREAN SECTION  1984, [place]    There were no vitals filed for this visit.   Subjective Assessment - 11/12/21 0832     Subjective COVID-19 screen performed prior to patient entering clinic. Feels like pain is getting better.    Pertinent History Unremarkable.    How long can you stand comfortably? >30 minutes.    How long can you walk comfortably? Okay with community distances but pain will rise the longger she is up.    Diagnostic tests X-rays.    Patient Stated Goals Avoid surgery.  Walk better.    Currently in Pain? No/denies                [facility] PT Assessment - 11/12/21 0001       Assessment   Medical Diagnosis Closed fracture of base of fifth metatarsal, left.    Referring Provider (PT) Becky Sax MD    Onset Date/Surgical Date 08/13/21    Next MD Visit 11/21/2021      Precautions   Precaution Comments Avoid heel lifts and stressing the metatarsals.      Restrictions   Weight Bearing Restrictions No                           [facility] Adult PT Treatment/Exercise - 11/12/21 0001       Modalities   Modalities Electrical Stimulation       Electrical Stimulation   Electrical Stimulation Location Left base of metatarsal and dorsum of left foot    Electrical Stimulation Action Pre-Mod    Electrical Stimulation Parameters 80-150 hz x10 min    Electrical Stimulation Goals Pain      Additional Ankle Exercises DO NOT USE   Towel Crunch Limitations x2 min      Ankle Exercises: Aerobic   Nustep Lvl 3 x 15 mins      Ankle Exercises: Standing   Rocker Board 3 minutes    Heel Raises 20 reps    Toe Raise 20 reps    Other Standing Ankle Exercises DLS on beam x2 min      Ankle Exercises: Seated   Towel Crunch Limitations    Other Seated Ankle Exercises L ankle isolator 1# x30 reps DF/PF, Inv/Ev                          PT Long Term Goals - 10/17/21 1146       PT LONG TERM GOAL #1  Title Independent with a HEP.    Time 6    Period Weeks    Status New      PT LONG TERM GOAL #2   Title Active left ankle dorsiflexion to 10 degrees to help normalize gait pattern.    Time 6    Period Weeks    Status New      PT LONG TERM GOAL #3   Title Stand/walk 30 minutes+ with pain not > 2/10.    Time 6    Period Weeks    Status New      PT LONG TERM GOAL #4   Title Walk a mile with pain not > 2/10.    Time 6    Period Weeks    Status New                   Plan - 11/12/21 1009     Clinical Impression Statement Patient presented in clinic with no complaints of pain. Patient feels as if R foot has really improved since starting PT. Patient progressed to more standing and proprioceptive training with no issues or pain. Patient progressed to instrinsic musclature strengthening in sitting with no complaints of pain or limitation. Normal stimulation response noted following removal of the modality.    Personal Factors and Comorbidities Other    Examination-Activity Limitations Locomotion Level;Other    Examination-Participation Restrictions Other    Stability/Clinical Decision Making Evolving/Moderate  complexity    Rehab Potential Good    PT Frequency 2x / week    PT Duration 6 weeks    PT Treatment/Interventions ADLs/Self Care Home Management;Cryotherapy;Electrical Stimulation;Moist Heat;Iontophoresis 4mg /ml Dexamethasone;Gait training;Therapeutic activities;Therapeutic exercise;Neuromuscular re-education;Manual techniques;Patient/family education;Passive range of motion    PT Next Visit Plan Review standing Gatroc stretch.  Added Soleus stretch.  Theraband exercises and ankle isolator for strengthening.  GT. Electrical stimulation.    Consulted and Agree with Plan of Care Patient             Patient will benefit from skilled therapeutic intervention in order to improve the following deficits and impairments:  Abnormal gait, Pain, Decreased range of motion, Decreased strength  Visit Diagnosis: Pain in left foot  Stiffness of left ankle, not elsewhere classified     Problem List There are no problems to display for this patient.   Standley Brooking, PTA 11/12/2021, 10:13 AM  [facility] [address] [place], [place], [address] Phone: [phone]   Fax:  [phone]  Name: Jackie Marsh MRN: [id number] Date of Birth: [birth year]

## 2021-11-19 ENCOUNTER — Ambulatory Visit: Payer: BC Managed Care – PPO | Attending: Sports Medicine

## 2021-11-19 ENCOUNTER — Other Ambulatory Visit: Payer: Self-pay

## 2021-11-19 DIAGNOSIS — M79672 Pain in left foot: Secondary | ICD-10-CM | POA: Diagnosis not present

## 2021-11-19 DIAGNOSIS — M25672 Stiffness of left ankle, not elsewhere classified: Secondary | ICD-10-CM | POA: Diagnosis present

## 2021-11-19 NOTE — Therapy (Addendum)
[facility] [place], [place], [address] Phone: [phone]   Fax:  [phone]  Physical Therapy Treatment  Patient Details  Name: Jackie Marsh MRN: [id number] Date of Birth: [birth year] Referring Provider (PT): Becky Sax MD   Encounter Date: 11/19/2021   PT End of Session - 11/19/21 0810     Visit Number 5    Number of Visits 12    Date for PT Re-Evaluation 11/28/21    PT Start Time 0815    PT Stop Time 0903    PT Time Calculation (min) 48 min    Activity Tolerance Patient tolerated treatment well    Behavior During Therapy [facility] for tasks assessed/performed             Past Medical History:  Diagnosis Date   Allergy     Past Surgical History:  Procedure Laterality Date   CESAREAN SECTION  1984, [place]    There were no vitals filed for this visit.   Subjective Assessment - 11/19/21 0810     Subjective COVID-19 screen performed prior to patient entering clinic.  Pt arrives for today's treatment session reporting 2/10 left foot pain.    Pertinent History Unremarkable.    How long can you stand comfortably? >30 minutes.    How long can you walk comfortably? Okay with community distances but pain will rise the longger she is up.    Diagnostic tests X-rays.    Patient Stated Goals Avoid surgery.  Walk better.    Currently in Pain? Yes    Pain Score 2     Pain Location Foot    Pain Orientation Left                               [facility] Adult PT Treatment/Exercise - 11/19/21 0001       Modalities   Modalities Electrical Stimulation      Electrical Stimulation   Electrical Stimulation Location Left base of metatarsal and dorsum of left foot    Electrical Stimulation Action Pre-mod    Electrical Stimulation Parameters 80-150 Hz x 15 mins    Electrical Stimulation Goals Pain      Ankle Exercises: Aerobic   Nustep Lvl 4 x 15 mins       Ankle Exercises: Standing   Rocker Board 3 minutes    Heel Raises 20 reps    Toe Raise 20 reps      Ankle Exercises: Machines for Strengthening   Cybex Leg Press 1 plate; heel raises 20 reps   heavy cues for technique     Ankle Exercises: Stretches   Soleus Stretch 4 reps;10 seconds    Gastroc Stretch 30 seconds;3 reps   on Rockerboard                         PT Long Term Goals - 11/19/21 4665       PT LONG TERM GOAL #1   Title Independent with a HEP.    Time 6    Period Weeks    Status Achieved      PT LONG TERM GOAL #2   Title Active left ankle dorsiflexion to 10 degrees to help normalize gait pattern.    Baseline 11/19/21; 18 degress of dorsiflexion    Time 6    Period Weeks    Status  Achieved      PT LONG TERM GOAL #3   Title Stand/walk 30 minutes+ with pain not > 2/10.    Time 6    Period Weeks    Status Achieved      PT LONG TERM GOAL #4   Title Walk a mile with pain not > 2/10.    Time 6    Period Weeks    Status On-going                   Plan - 11/19/21 5170     Clinical Impression Statement Pt arrives for today's treatment session reporting 2/10 left foot pain.  Pt states that damp weather tends to increase her soreness.  Has an appointment with her MD on Friday.  Pt able to tolerate added resistance to Nustep without issue or complaint.  Pt able to tolerate heel raises on leg press well, requiring heavy cues for proper technique.  Pt has met all of her goals at this time except being able to walk 1 mile with pain < 2/10.  Normal response to estim noted at completion of session.  Pt denied any pain at completion of today's treatment session.             Patient will benefit from skilled therapeutic intervention in order to improve the following deficits and impairments:  Abnormal gait, Pain, Decreased range of motion, Decreased strength  Visit Diagnosis: Pain in left foot  Stiffness of left ankle, not elsewhere  classified     Problem List There are no problems to display for this patient.   Kathrynn Ducking, PTA 11/19/2021, 9:13 AM  [facility] [address] [place], [place], [address] Phone: [phone]   Fax:  [phone]  Name: Jackie Marsh MRN: [id number] Date of Birth: [birth year]  PHYSICAL THERAPY DISCHARGE SUMMARY  Visits from Start of Care: 5.  Current functional level related to goals / functional outcomes: See above.   Remaining deficits: See goal section.   Education / Equipment: HEP.   Patient agrees to discharge. Patient goals were partially met. Patient is being discharged due to being pleased with the current functional level.    Mali Applegate MPT

## 2022-12-01 ENCOUNTER — Other Ambulatory Visit: Payer: Self-pay | Admitting: Obstetrics and Gynecology

## 2022-12-01 DIAGNOSIS — Z780 Asymptomatic menopausal state: Secondary | ICD-10-CM

## 2022-12-25 ENCOUNTER — Ambulatory Visit
Admission: RE | Admit: 2022-12-25 | Discharge: 2022-12-25 | Disposition: A | Discharge status: Home / Self Care | Payer: BC Managed Care – PPO | Source: Ambulatory Visit | Attending: Obstetrics and Gynecology | Admitting: Obstetrics and Gynecology

## 2022-12-25 DIAGNOSIS — Z780 Asymptomatic menopausal state: Secondary | ICD-10-CM

## 2023-06-02 ENCOUNTER — Other Ambulatory Visit: Payer: BC Managed Care – PPO

## 2024-08-02 ENCOUNTER — Ambulatory Visit: Admitting: Internal Medicine

## 2024-11-20 ENCOUNTER — Telehealth: Payer: Self-pay

## 2024-11-20 NOTE — Telephone Encounter (Signed)
 Attempted to reach patient concerning colonoscopy recall; unable to speak with patient;  left message and number to the office for patient to call back and schedule appts;

## 2024-11-24 NOTE — Telephone Encounter (Signed)
 Called patient again- patient given recall recommendations and has agreed to be scheduled for her procedure in Jan 2026;  PV appt made and [facility] procedure scheduled; Patient advised to call back to the office at [phone] should questions/concerns arise; Patient verbalized understanding of information/instructions;

## 2024-12-01 ENCOUNTER — Ambulatory Visit

## 2024-12-01 VITALS — Ht 67.5 in | Wt 140.0 lb

## 2024-12-01 DIAGNOSIS — Z800 Family history of malignant neoplasm of digestive organs: Secondary | ICD-10-CM

## 2024-12-01 DIAGNOSIS — Z1211 Encounter for screening for malignant neoplasm of colon: Secondary | ICD-10-CM

## 2024-12-01 MED ORDER — NA SULFATE-K SULFATE-MG SULF 17.5-3.13-1.6 GM/177ML PO SOLN: 1.0000 | Freq: Once | ORAL | 0 refills | Status: AC

## 2024-12-01 NOTE — Progress Notes (Addendum)
 Pt's name and DOB verified at the beginning of the pre-visit with 2 identifiers   Pt denies any difficulty with ambulating,sitting, laying down or rolling side to side  Pt has no issues moving head neck or swallowing  No egg or soy allergy known to patient   No issues known to pt with past sedation  No FH of Malignant Hyperthermia  Pt is not on home 02   Pt is not on blood thinners   Pt denies issues with constipation   Pt is not on dialysis  Pt denise any abnormal heart rhythms   Pt denies any upcoming cardiac testing.    Visit by phone  Pt states weight is 140 lb   Pt given  both [facility] main # and MD on call # prior to instructions.  Informed pt to come in at the time discussed and is shown on PV instructions.  Pt instructed to use [employer] or GoodRx for a price reduction on prep  Instructed pt where to find PV instructions in My Chart. . Instructed pt on all aspects of written instructions including med holds clothing to wear and foods to eat and not eat as well as after procedure legal restrictions and to call MD on call if needed.. Pt states understanding. Instructed pt to review instructions again prior to procedure and call main # given if has any questions or any issues. Pt states they will.

## 2024-12-29 ENCOUNTER — Encounter: Admitting: Pediatrics
